# Patient Record
Sex: Male | Born: 1965 | Race: White | Hispanic: No | Marital: Married | Smoking: Never smoker
Health system: Southern US, Community
[De-identification: ages and names within clinical notes are randomized; demographics above are authoritative.]

## PROBLEM LIST (undated history)

## (undated) DIAGNOSIS — I10 Essential (primary) hypertension: Secondary | ICD-10-CM

## (undated) DIAGNOSIS — G4730 Sleep apnea, unspecified: Secondary | ICD-10-CM

## (undated) DIAGNOSIS — F32A Depression, unspecified: Secondary | ICD-10-CM

## (undated) DIAGNOSIS — F429 Obsessive-compulsive disorder, unspecified: Secondary | ICD-10-CM

## (undated) DIAGNOSIS — F419 Anxiety disorder, unspecified: Secondary | ICD-10-CM

## (undated) DIAGNOSIS — Z87442 Personal history of urinary calculi: Secondary | ICD-10-CM

## (undated) DIAGNOSIS — N200 Calculus of kidney: Secondary | ICD-10-CM

## (undated) DIAGNOSIS — R7303 Prediabetes: Secondary | ICD-10-CM

## (undated) HISTORY — DX: Obsessive-compulsive disorder, unspecified: F42.9

## (undated) HISTORY — DX: Calculus of kidney: N20.0

## (undated) HISTORY — DX: Sleep apnea, unspecified: G47.30

## (undated) HISTORY — PX: TONSILLECTOMY: SUR1361

## (undated) HISTORY — PX: KNEE ARTHROSCOPY W/ ACL RECONSTRUCTION: SHX1858

## (undated) HISTORY — PX: UPPER GASTROINTESTINAL ENDOSCOPY: SHX188

## (undated) HISTORY — PX: KIDNEY STONE SURGERY: SHX686

---
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
## 2004-05-28 ENCOUNTER — Encounter: Admission: RE | Admit: 2004-05-28 | Discharge: 2004-05-28 | Payer: Self-pay | Admitting: Sports Medicine

## 2004-06-04 ENCOUNTER — Encounter: Admission: RE | Admit: 2004-06-04 | Discharge: 2004-06-04 | Payer: Self-pay | Admitting: Sports Medicine

## 2004-06-10 ENCOUNTER — Encounter: Admission: RE | Admit: 2004-06-10 | Discharge: 2004-06-10 | Payer: Self-pay | Admitting: Family Medicine

## 2004-06-20 ENCOUNTER — Ambulatory Visit: Payer: Self-pay | Admitting: Sports Medicine

## 2004-07-18 ENCOUNTER — Ambulatory Visit: Payer: Self-pay | Admitting: Sports Medicine

## 2004-10-10 ENCOUNTER — Ambulatory Visit: Payer: Self-pay | Admitting: Sports Medicine

## 2004-10-17 ENCOUNTER — Ambulatory Visit: Payer: Self-pay | Admitting: Sports Medicine

## 2016-08-06 ENCOUNTER — Emergency Department (HOSPITAL_COMMUNITY)
Admission: EM | Admit: 2016-08-06 | Discharge: 2016-08-06 | Disposition: A | Discharge status: Home / Self Care | Payer: BC Managed Care – PPO | Attending: Emergency Medicine | Admitting: Emergency Medicine

## 2016-08-06 ENCOUNTER — Emergency Department (HOSPITAL_COMMUNITY): Payer: BC Managed Care – PPO

## 2016-08-06 ENCOUNTER — Encounter (HOSPITAL_COMMUNITY): Payer: Self-pay | Admitting: Emergency Medicine

## 2016-08-06 DIAGNOSIS — Y92410 Unspecified street and highway as the place of occurrence of the external cause: Secondary | ICD-10-CM | POA: Insufficient documentation

## 2016-08-06 DIAGNOSIS — Y999 Unspecified external cause status: Secondary | ICD-10-CM | POA: Diagnosis not present

## 2016-08-06 DIAGNOSIS — S3992XA Unspecified injury of lower back, initial encounter: Secondary | ICD-10-CM | POA: Diagnosis present

## 2016-08-06 DIAGNOSIS — S161XXA Strain of muscle, fascia and tendon at neck level, initial encounter: Secondary | ICD-10-CM | POA: Diagnosis not present

## 2016-08-06 DIAGNOSIS — R42 Dizziness and giddiness: Secondary | ICD-10-CM | POA: Insufficient documentation

## 2016-08-06 DIAGNOSIS — Y939 Activity, unspecified: Secondary | ICD-10-CM | POA: Diagnosis not present

## 2016-08-06 DIAGNOSIS — I10 Essential (primary) hypertension: Secondary | ICD-10-CM | POA: Diagnosis not present

## 2016-08-06 DIAGNOSIS — S39012A Strain of muscle, fascia and tendon of lower back, initial encounter: Secondary | ICD-10-CM | POA: Diagnosis not present

## 2016-08-06 HISTORY — DX: Essential (primary) hypertension: I10

## 2016-08-06 MED ORDER — IBUPROFEN 800 MG PO TABS: 800.0000 mg | ORAL_TABLET | Freq: Once | ORAL | Status: DC

## 2016-08-06 NOTE — Discharge Instructions (Signed)
Take two naproxen (Aleve) tablets, twice a day. Take acetaminophen as needed for pain. Apply ice to any painful area.
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
## 2017-11-04 ENCOUNTER — Encounter: Payer: Self-pay | Admitting: Internal Medicine

## 2017-12-15 ENCOUNTER — Other Ambulatory Visit: Payer: Self-pay

## 2017-12-15 ENCOUNTER — Ambulatory Visit (AMBULATORY_SURGERY_CENTER): Payer: Self-pay | Admitting: *Deleted

## 2017-12-15 VITALS — Ht 67.0 in | Wt 200.0 lb

## 2017-12-15 DIAGNOSIS — Z1211 Encounter for screening for malignant neoplasm of colon: Secondary | ICD-10-CM

## 2017-12-15 MED ORDER — NA SULFATE-K SULFATE-MG SULF 17.5-3.13-1.6 GM/177ML PO SOLN: ORAL | 0 refills | Status: DC

## 2017-12-15 NOTE — Progress Notes (Signed)
Patient denies any allergies to eggs or soy. Patient denies any problems with anesthesia/sedation. Patient denies any oxygen use at home. Patient denies taking any diet/weight loss medications or blood thinners. EMMI education assisgned to patient on colonoscopy, this was explained and instructions given to patient. Suprep coupon printed and given to pt during PV.

## 2017-12-16 ENCOUNTER — Encounter: Payer: Self-pay | Admitting: Internal Medicine

## 2017-12-29 ENCOUNTER — Encounter: Payer: Self-pay | Admitting: Internal Medicine

## 2017-12-29 ENCOUNTER — Ambulatory Visit (AMBULATORY_SURGERY_CENTER): Payer: BC Managed Care – PPO | Admitting: Internal Medicine

## 2017-12-29 ENCOUNTER — Other Ambulatory Visit: Payer: Self-pay

## 2017-12-29 VITALS — BP 107/79 | HR 75 | Temp 98.0°F | Resp 11 | Ht 67.0 in | Wt 200.0 lb

## 2017-12-29 DIAGNOSIS — Z1212 Encounter for screening for malignant neoplasm of rectum: Secondary | ICD-10-CM | POA: Diagnosis not present

## 2017-12-29 DIAGNOSIS — Z1211 Encounter for screening for malignant neoplasm of colon: Secondary | ICD-10-CM | POA: Diagnosis not present

## 2017-12-29 MED ORDER — SODIUM CHLORIDE 0.9 % IV SOLN: 500.0000 mL | Freq: Once | INTRAVENOUS | Status: DC

## 2017-12-29 NOTE — Progress Notes (Signed)
Pt's states no medical or surgical changes since previsit or office visit. 

## 2017-12-29 NOTE — Progress Notes (Signed)
Report given to PACU, vss 

## 2017-12-29 NOTE — Op Note (Signed)
[facility] Patient Name: Dakota Munoz Procedure Date: 12/29/2017 11:08 AM MRN: [id number] Endoscopist: Dakota Munoz , MD Age: 52 Referring MD:  Date of Birth: Jun 26, 1966 Gender: Male Account #: [id number] Procedure:                Colonoscopy Indications:              Screening for colorectal malignant neoplasm, This                            is the patient's first colonoscopy Medicines:                Monitored Anesthesia Care Procedure:                Pre-Anesthesia Assessment:                           - Prior to the procedure, a History and Physical                            was performed, and patient medications and                            allergies were reviewed. The patient's tolerance of                            previous anesthesia was also reviewed. The risks                            and benefits of the procedure and the sedation                            options and risks were discussed with the patient.                            All questions were answered, and informed consent                            was obtained. Prior Anticoagulants: The patient has                            taken no previous anticoagulant or antiplatelet                            agents. ASA Grade Assessment: II - A patient with                            mild systemic disease. After reviewing the risks                            and benefits, the patient was deemed in                            satisfactory condition to undergo the procedure.  After obtaining informed consent, the colonoscope                            was passed under direct vision. Throughout the                            procedure, the patient's blood pressure, pulse, and                            oxygen saturations were monitored continuously. The                            Colonoscope was introduced through the anus and                            advanced to the the cecum,  identified by                            appendiceal orifice and ileocecal valve. The                            colonoscopy was performed without difficulty. The                            patient tolerated the procedure well. The quality                            of the bowel preparation was excellent. The                            ileocecal valve, appendiceal orifice, and rectum                            were photographed. Scope In: 11:15:11 AM Scope Out: 11:25:07 AM Scope Withdrawal Time: 0 hours 7 minutes 45 seconds  Total Procedure Duration: 0 hours 9 minutes 56 seconds  Findings:                 The digital rectal exam was normal.                           Internal hemorrhoids were found during endoscopy,                            retroflexion in the rectum was not performed due to                            anatomy (narrow rectal vault). The hemorrhoids were                            medium-sized.                           The exam was otherwise without abnormality. No  polyps seen. Complications:            No immediate complications. Estimated Blood Loss:     Estimated blood loss: none. Impression:               - Internal hemorrhoids.                           - The examination was otherwise normal. No polyps                            seen.                           - No specimens collected. Recommendation:           - Patient has a contact number available for                            emergencies. The signs and symptoms of potential                            delayed complications were discussed with the                            patient. Return to normal activities tomorrow.                            Written discharge instructions were provided to the                            patient.                           - Resume previous diet.                           - Continue present medications.                           - Repeat  colonoscopy in 10 years for screening                            purposes. Dakota FiedlerJay M Blakelyn Dinges, MD 12/29/2017 11:29:47 AM This report has been signed electronically.

## 2017-12-29 NOTE — Patient Instructions (Signed)
Handout given on hemorrhoids.  YOU HAD AN ENDOSCOPIC PROCEDURE TODAY: Refer to the procedure report and other information in the discharge instructions given to you for any specific questions about what was found during the examination. If this information does not answer your questions, please call [place] office at [phone] to clarify.   YOU SHOULD EXPECT: Some feelings of bloating in the abdomen. Passage of more gas than usual. Walking can help get rid of the air that was put into your GI tract during the procedure and reduce the bloating. If you had a lower endoscopy (such as a colonoscopy or flexible sigmoidoscopy) you may notice spotting of blood in your stool or on the toilet paper. Some abdominal soreness may be present for a day or two, also.  DIET: Your first meal following the procedure should be a light meal and then it is ok to progress to your normal diet. A half-sandwich or bowl of soup is an example of a good first meal. Heavy or fried foods are harder to digest and may make you feel nauseous or bloated. Drink plenty of fluids but you should avoid alcoholic beverages for 24 hours. If you had a esophageal dilation, please see attached instructions for diet.    ACTIVITY: Your care partner should take you home directly after the procedure. You should plan to take it easy, moving slowly for the rest of the day. You can resume normal activity the day after the procedure however YOU SHOULD NOT DRIVE, use power tools, machinery or perform tasks that involve climbing or major physical exertion for 24 hours (because of the sedation medicines used during the test).   SYMPTOMS TO REPORT IMMEDIATELY: A gastroenterologist can be reached at any hour. Please call [phone]  for any of the following symptoms:  . Following lower endoscopy (colonoscopy, flexible sigmoidoscopy) Excessive amounts of blood in the stool  Significant tenderness, worsening of abdominal pains  Swelling of the abdomen  that is new, acute  Fever of 100 or higher  FOLLOW UP:  If any biopsies were taken you will be contacted by phone or by letter within the next 1-3 weeks. Call [phone]  if you have not heard about the biopsies in 3 weeks.  Please also call with any specific questions about appointments or follow up tests. 

## 2017-12-30 ENCOUNTER — Telehealth: Payer: Self-pay

## 2017-12-30 NOTE — Telephone Encounter (Signed)
  Follow up Call-  Call back number 12/29/2017  Post procedure Call Back phone  # [phone]  Permission to leave phone message Yes  Some recent data might be hidden     Patient questions:  Do you have a fever, pain , or abdominal swelling? No. Pain Score  0 *  Have you tolerated food without any problems? Yes.    Have you been able to return to your normal activities? Yes.    Do you have any questions about your discharge instructions: Diet   No. Medications  No. Follow up visit  No.  Do you have questions or concerns about your Care? No.  Actions: * If pain score is 4 or above: No action needed, pain <4.

## 2018-01-11 ENCOUNTER — Ambulatory Visit: Payer: BC Managed Care – PPO | Admitting: Internal Medicine

## 2018-11-24 ENCOUNTER — Other Ambulatory Visit: Payer: Self-pay | Admitting: Urology

## 2018-11-28 ENCOUNTER — Ambulatory Visit (HOSPITAL_COMMUNITY): Admission: RE | Admit: 2018-11-28 | Payer: BC Managed Care – PPO | Source: Home / Self Care | Admitting: Urology

## 2018-11-28 SURGERY — LITHOTRIPSY, ESWL
Anesthesia: LOCAL | Laterality: Left

## 2018-12-08 ENCOUNTER — Other Ambulatory Visit: Payer: Self-pay | Admitting: Urology

## 2018-12-14 NOTE — H&P (Signed)
CC: I have a history of kidney stones.  HPI: Dakota Munoz is a 53 year-old male established patient who is here for F/U due to a history of renal calculi.  10/24/2018: He was last seen in the Spring of 2018 for a left ureteral calculi seen on KUB imaging performed at the time of his last visit. The patient did not f/u. Presents today for concerns related to possible kidney stone. He tells me he passed a stone after being seen last in 2018. That was the last stone event he had.   Complaining of left lower back pain radiating into the left flank and left lower abdomen and with associated nausea and intermittent burning with urination 3 days. He has not had gross hematuria or known passage of stone material. Denies an increase in frequency/urgency, changes in urine stream. Denies fevers.    11/08/2018: KUB at last visit demonstrated a left proximal ureteral calculi. Medical expulsive therapy was initiated. He has continued to have intermittent radiating left lower back pain into the left flank as well as new pain in the right lower back. Denies interval stone material passage. He has not had burning or painful urination, gross hematuria. He has encountered some intermittent urinary urgency and intermittency of urine stream. Denies fevers/chills, tolerating oral intake without issue.     The patient was last seen 10/24/2017. The patient's stone was on his left side. He did not pass a stone since the last office visit. The patient has been taking Tamsulosin for their calculus disease.   The patient has had flank pain since they were last seen. The patient has developed urgency and intermittency. He has no seen blood in his urine since the last visit. He is currently having back pain. He denies having flank pain, groin pain, nausea, vomiting, fever, and chills. He has had Ureteroscopy for treatment of his stones in the past.     ALLERGIES: No Allergies    MEDICATIONS: Tamsulosin Hcl 0.4 mg  capsule 1 capsule PO Daily  Losartan Potassium 25 mg tablet  Ondansetron Odt 4 mg tablet,disintegrating 1 tablet PO Q 6 H PRN  Zoloft 25 mg tablet     GU PSH: Cystoscopy Ureteroscopy - 2008      PSH Notes: Cystoscopy With Ureteroscopy Right, Kidney Surgery Right wrist surg, And an ACL repair   NON-GU PSH: No Non-GU PSH    GU PMH: Renal calculus - 10/24/2018, 3 left renal calculi not in a position where they would cause symptoms. He has a 3 mm left renal stone which is symptomatic., - 02/17/2017, Kidney stone on left side, - 2015 Ureteral calculus, Left - 10/24/2018, Proximal Ureteral Stone On The Left, - 2014 Abdominal Pain Unspec, Pain, flank, bilateral - 2015 Encounter for Prostate Cancer screening, Prostate cancer screening - 2015 Primary hypogonadism, Hypogonadism, testicular - 2015 BPH w/o LUTS, Benign prostatic hypertrophy without lower urinary tract symptoms - 2014 ED due to arterial insufficiency, Erectile dysfunction due to arterial insufficiency - 2014 History of urolithiasis, Nephrolithiasis - 2014 Low back pain, Lumbago - 2014 Personal Hx Oth male genital organs diseases, History of epididymitis - 2014      PMH Notes:  2012-02-03 15:41:39 - Note: Urinary Calculus On The Right   NON-GU PMH: Encounter for general adult medical examination without abnormal findings, Encounter for preventive health examination - 2015 Fever, unspecified, Fever - 2015 Anxiety    FAMILY HISTORY: Death - Mother Heart Disease - Uncle, Father, Grandmother nephrolithiasis - Father  SOCIAL HISTORY: Marital Status: Married Preferred Language: English; Ethnicity: Not Hispanic Or Latino; Race: White Current Smoking Status: Patient has never smoked.   Tobacco Use Assessment Completed: Used Tobacco in last 30 days? Social Drinker.  Drinks 4+ caffeinated drinks per day. Patient's occupation Psychologist, clinical.     Notes: Never A Smoker, Alcohol Use, Occupation:, Caffeine Use, Marital  History - Currently Married   REVIEW OF SYSTEMS:    GU Review Male:   Patient denies have to strain to urinate , erection problems, hard to postpone urination, frequent urination, trouble starting your stream, burning/ pain with urination, get up at night to urinate, stream starts and stops, penile pain, and leakage of urine.  Gastrointestinal (Upper):   Patient denies nausea, vomiting, and indigestion/ heartburn.  Gastrointestinal (Lower):   Patient denies diarrhea and constipation.  Constitutional:   Patient denies fever, night sweats, weight loss, and fatigue.  Skin:   Patient denies skin rash/ lesion and itching.  Eyes:   Patient denies blurred vision and double vision.  Ears/ Nose/ Throat:   Patient denies sore throat and sinus problems.  Hematologic/Lymphatic:   Patient denies swollen glands and easy bruising.  Cardiovascular:   Patient denies leg swelling and chest pains.  Respiratory:   Patient denies cough and shortness of breath.  Endocrine:   Patient denies excessive thirst.  Musculoskeletal:   Patient reports back pain. Patient denies joint pain.  Neurological:   Patient denies headaches and dizziness.  Psychologic:   Patient denies depression and anxiety.     MULTI-SYSTEM PHYSICAL EXAMINATION:    Constitutional: Well-nourished. No physical deformities. Normally developed. Good grooming.  Neck: Neck symmetrical, not swollen. Normal tracheal position.  Respiratory: Normal breath sounds. No labored breathing, no use of accessory muscles.   Cardiovascular: Regular rate and rhythm. No murmur, no gallop. Normal temperature, normal extremity pulses, no swelling, no varicosities.   Neurologic / Psychiatric: Oriented to time, oriented to place, oriented to person. No depression, no anxiety, no agitation.  Gastrointestinal: No mass, no tenderness, no rigidity, non obese abdomen. There is no CVA, flank, lower abdominal tenderness.  Musculoskeletal: Normal gait and station of head and  neck.     PAST DATA REVIEWED:  Source Of History:  Patient  Records Review:   Previous Patient Records  Urine Test Review:   Urinalysis  X-Ray Review: KUB: Reviewed Films. Discussed With Patient.  [facility] Protocol: Reviewed Films. Discussed With Patient.     01/31/14 01/26/12 03/12/11 01/22/10  PSA  Total PSA 0.68  0.53  0.72  0.77     01/31/14 11/15/12 01/26/12 10/28/11 03/12/11 01/22/10 10/23/09 09/16/09  Hormones  Testosterone, Total 250  526.55  602.07  988.44  845.64  617.0  473.0  327.0     11/08/18  Urinalysis  Urine Appearance Clear   Urine Color Yellow   Urine Glucose Neg mg/dL  Urine Bilirubin Neg mg/dL  Urine Ketones Neg mg/dL  Urine Specific Gravity 1.020   Urine Blood Trace ery/uL  Urine pH 5.5   Urine Protein Neg mg/dL  Urine Urobilinogen 0.2 mg/dL  Urine Nitrites Neg   Urine Leukocyte Esterase Neg leu/uL  Urine WBC/hpf 0 - 5/hpf   Urine RBC/hpf 0 - 2/hpf   Urine Epithelial Cells 0 - 5/hpf   Urine Bacteria Few (10-25/hpf)   Urine Mucous Not Present   Urine Yeast NS (Not Seen)   Urine Trichomonas Not Present   Urine Cystals NS (Not Seen)   Urine Casts NS (Not Seen)  Urine Sperm Not Present    PROCEDURES:         C.T. Urogram - [id number]                KUB - [id number]  A single view of the abdomen is obtained. There is a new opacity overlying the proximal portion of the right sacrum that was not clearly defined on last imaging study. Based on this, I can't exclude the possibility of a right ureteral calculus in the proximal portion of the right ureter. 36mm opacity noted on the left between left L3 and L4. This is the previously seen left proximal ureteral calculus that has only descended a few cm from imaging study two weeks ago.               Urinalysis w/Scope Dipstick Dipstick Cont'd Micro  Color: Yellow Bilirubin: Neg mg/dL WBC/hpf: 0 - 5/hpf  Appearance: Clear Ketones: Neg mg/dL RBC/hpf: 0 - 2/hpf  Specific Gravity: 1.020 Blood: Trace ery/uL  Bacteria: Few (10-25/hpf)  pH: 5.5 Protein: Neg mg/dL Cystals: NS (Not Seen)  Glucose: Neg mg/dL Urobilinogen: 0.2 mg/dL Casts: NS (Not Seen)    Nitrites: Neg Trichomonas: Not Present    Leukocyte Esterase: Neg leu/uL Mucous: Not Present      Epithelial Cells: 0 - 5/hpf      Yeast: NS (Not Seen)      Sperm: Not Present    ASSESSMENT:      ICD-10 Details  1 GU:   Renal calculus - N20.0   2   Ureteral calculus - N20.1 Left   PLAN:            Medications Refill Meds: Oxycodone-Acetaminophen 5 mg-325 mg tablet 1 tablet PO Q 6 H PRN   #10  0 Refill(s)            Orders Labs Urine Culture  X-Rays: [facility] Protocol Without Contrast - r/o right ureteral calculi, known left ureteral calculus    KUB  X-Ray Notes: History:  Hematuria: Yes/No  Patient to see MD after exam: Yes/No  Previous exam: CT / IVP/ US/ KUB/ None  When:  Where:  Diabetic: Yes/ No  BUN/ Creatinine:  Date of last BUN Creatinine:  Weight in pounds:  Allergy- IV Contrast: Yes/ No  Conflicting diabetic meds: Yes/ No  Diabetic Meds:  Prior Authorization #: [id number]           Schedule Return Visit/Planned Activity: 1-2 Weeks - Schedule Surgery           Notes:   CT imaging did not show an obvious right-sided ureteral calculus or obstructive signs on that side. I'll follow-up with him regarding official radiologist's interpretation. It did confirm the presence of a left proximal ureteral calculi with moderate upstream hydronephrosis. Historically the patient has not been any passed stones requiring some form of procedural intervention. Given the calculus in question was easily visualized on today's KUB, I recommended shockwave lithotripsy to him and his wife, pending his urologist's approval.   For shockwave lithotripsy I described the risks which include arrhythmia, kidney contusion, kidney hemorrhage, need for transfusion, long-term risk of diabetes or hypertension, back discomfort, flank  ecchymosis, flank abrasion, inability to break up stone, inability to pass stone fragments, Steinstrasse, infection associated with obstructing stones, need for different surgical procedure and possible need for repeat shockwave lithotripsy.   He will continue tamsulosin and Zofran as needed. I gave her refill for some additional pain medication today but recommended using ibuprofen until  2 days before planned procedure. He'll telephone [place] with any worsening symptoms, fevers or interval same material passage.

## 2018-12-15 ENCOUNTER — Ambulatory Visit (HOSPITAL_COMMUNITY)
Admission: RE | Admit: 2018-12-15 | Discharge: 2018-12-15 | Disposition: A | Discharge status: Home / Self Care | Payer: BC Managed Care – PPO | Attending: Urology | Admitting: Urology

## 2018-12-15 ENCOUNTER — Encounter (HOSPITAL_COMMUNITY): Payer: Self-pay | Admitting: General Practice

## 2018-12-15 ENCOUNTER — Ambulatory Visit (HOSPITAL_COMMUNITY): Payer: BC Managed Care – PPO

## 2018-12-15 ENCOUNTER — Encounter (HOSPITAL_COMMUNITY)
Admission: RE | Disposition: A | Discharge status: Home / Self Care | Payer: Self-pay | Source: Home / Self Care | Attending: Urology

## 2018-12-15 DIAGNOSIS — F419 Anxiety disorder, unspecified: Secondary | ICD-10-CM | POA: Diagnosis not present

## 2018-12-15 DIAGNOSIS — N201 Calculus of ureter: Secondary | ICD-10-CM

## 2018-12-15 DIAGNOSIS — N202 Calculus of kidney with calculus of ureter: Secondary | ICD-10-CM | POA: Diagnosis present

## 2018-12-15 DIAGNOSIS — Z79899 Other long term (current) drug therapy: Secondary | ICD-10-CM | POA: Diagnosis not present

## 2018-12-15 HISTORY — PX: EXTRACORPOREAL SHOCK WAVE LITHOTRIPSY: SHX1557

## 2018-12-15 HISTORY — DX: Personal history of urinary calculi: Z87.442

## 2018-12-15 SURGERY — LITHOTRIPSY, ESWL
Anesthesia: LOCAL | Laterality: Left

## 2018-12-15 MED ORDER — DIAZEPAM 5 MG PO TABS
10.0000 mg | ORAL_TABLET | ORAL | Status: AC
  Administered 2018-12-15: 10 mg via ORAL
  Filled 2018-12-15: qty 2

## 2018-12-15 MED ORDER — OXYCODONE-ACETAMINOPHEN 5-325 MG PO TABS: 1.0000 | ORAL_TABLET | Freq: Four times a day (QID) | ORAL | 0 refills | Status: AC | PRN

## 2018-12-15 MED ORDER — SODIUM CHLORIDE 0.9 % IV SOLN
INTRAVENOUS | Status: DC
  Administered 2018-12-15: 09:00:00 via INTRAVENOUS

## 2018-12-15 MED ORDER — DIPHENHYDRAMINE HCL 25 MG PO CAPS
25.0000 mg | ORAL_CAPSULE | ORAL | Status: AC
  Administered 2018-12-15: 25 mg via ORAL
  Filled 2018-12-15: qty 1

## 2018-12-15 MED ORDER — CIPROFLOXACIN HCL 500 MG PO TABS
500.0000 mg | ORAL_TABLET | ORAL | Status: AC
  Administered 2018-12-15: 500 mg via ORAL
  Filled 2018-12-15: qty 1

## 2018-12-15 NOTE — Op Note (Signed)
See [facility] operative note scanned into chart. Also because of the size, density, location and other factors that cannot be anticipated I feel this will likely be a staged procedure. This fact supersedes any indication in the scanned [facility] operative note to the contrary.  

## 2018-12-15 NOTE — Discharge Instructions (Signed)
1. You should strain your urine and collect all fragments and bring them to your follow up appointment.  °2. You should take your pain medication as needed.  Please call if your pain is severe to the point that it is not controlled with your pain medication. °3. You should call if you develop fever > 101 or persistent nausea or vomiting. °4. Your doctor may prescribe tamsulosin to take to help facilitate stone passage. °

## 2018-12-16 ENCOUNTER — Encounter (HOSPITAL_COMMUNITY): Payer: Self-pay | Admitting: Urology

## 2021-01-09 DIAGNOSIS — Z8249 Family history of ischemic heart disease and other diseases of the circulatory system: Secondary | ICD-10-CM | POA: Insufficient documentation

## 2021-01-09 DIAGNOSIS — I10 Essential (primary) hypertension: Secondary | ICD-10-CM | POA: Insufficient documentation

## 2021-01-09 NOTE — Progress Notes (Signed)
Cardiology Office Note   Date:  01/10/2021   ID:  Dakota Munoz, DOB [birth year], MRN [id number]  PCP:  Ignacia Palma., MD  Cardiologist:   Rollene Rotunda, MD Referring:   Self  Chief Complaint  Patient presents with  . Chest Pain      History of Present Illness: Dakota Munoz is a 55 y.o. male who was a self referral secondary to a family history of CAD.  His father had a heart attack in his 45s.  He died of a stroke in his 50s.  His sister actually is a half sister with the same father has an ICD that she did not have coronary disease and we texted her during the visit.  Hers was related to valvular disease and apparently some other issues.  He is very active.  He does a lot of yard work.  However, he does get some chest discomfort.  This is right-sided.  It happens more at rest.  Its not typically with the activities.  It is sharp.  At times he has noticed that after meals.  This lasts for a few seconds.  He does not have any radiation to his jaw or to his arms.  He has no associated shortness of breath, nausea or vomiting.  Has had no new palpitations, presyncope or syncope.  He has never had this kind of discomfort before.  Past Medical History:  Diagnosis Date  . History of kidney stones   . Hypertension   . Kidney stones   . OCD (obsessive compulsive disorder)   . Sleep apnea    does not use the CPAP he has at home     Past Surgical History:  Procedure Laterality Date  . EXTRACORPOREAL SHOCK WAVE LITHOTRIPSY Left 12/15/2018   Procedure: EXTRACORPOREAL SHOCK WAVE LITHOTRIPSY (ESWL);  Surgeon: Heloise Purpura, MD;  Location: [facility];  Service: Urology;  Laterality: Left;  . KIDNEY STONE SURGERY    . KNEE ARTHROSCOPY W/ ACL RECONSTRUCTION Left    x2- left  . TONSILLECTOMY    . UPPER GASTROINTESTINAL ENDOSCOPY  15 years ago    in [place]  . WRIST ARTHROSCOPY Right 1976     Current Outpatient Medications  Medication Sig Dispense Refill  . buPROPion (WELLBUTRIN XL)  150 MG 24 hr tablet Take 150 mg by mouth daily.    Marland Kitchen losartan (COZAAR) 50 MG tablet Take 25 mg by mouth daily.    . ondansetron (ZOFRAN) 4 MG tablet Take 4 mg by mouth every 8 (eight) hours as needed for nausea or vomiting.    Marland Kitchen oxyCODONE-acetaminophen (PERCOCET/ROXICET) 5-325 MG tablet Take 1-2 tablets by mouth every 4 (four) hours as needed for severe pain.    Marland Kitchen sertraline (ZOLOFT) 25 MG tablet Take 25 mg by mouth daily.    . tamsulosin (FLOMAX) 0.4 MG CAPS capsule Take 0.4 mg by mouth.     No current facility-administered medications for this visit.    Allergies:   Patient has no known allergies.    Social History:  The patient  reports that he has never smoked. He has never used smokeless tobacco. He reports current alcohol use. He reports that he does not use drugs.   Family History:  The patient's family history includes Heart attack (age of onset: 64) in his father; Heart disease in his sister; Stroke (age of onset: 85) in his father; Ulcerative colitis in his son.    ROS:  Please see the history of present illness.  Otherwise, review of systems are positive for none.   All other systems are reviewed and negative.    PHYSICAL EXAM: VS:  BP 132/84   Pulse 73   Ht 5\' 7"  (1.702 m)   Wt 197 lb 9.6 oz (89.6 kg)   SpO2 97%   BMI 30.95 kg/m  , BMI Body mass index is 30.95 kg/m. GENERAL:  Well appearing HEENT:  Pupils equal round and reactive, fundi not visualized, oral mucosa unremarkable NECK:  No jugular venous distention, waveform within normal limits, carotid upstroke brisk and symmetric, no bruits, no thyromegaly LYMPHATICS:  No cervical, inguinal adenopathy LUNGS:  Clear to auscultation bilaterally BACK:  No CVA tenderness CHEST:  Unremarkable HEART:  PMI not displaced or sustained,S1 and S2 within normal limits, no S3, no S4, no clicks, no rubs, no murmurs ABD:  Flat, positive bowel sounds normal in frequency in pitch, no bruits, no rebound, no guarding, no midline  pulsatile mass, no hepatomegaly, no splenomegaly EXT:  2 plus pulses throughout, no edema, no cyanosis no clubbing SKIN:  No rashes no nodules NEURO:  Cranial nerves II through XII grossly intact, motor grossly intact throughout PSYCH:  Cognitively intact, oriented to person place and time    EKG:  EKG is ordered today. The ekg ordered today demonstrates sinus rhythm, incomplete right bundle branch block, axis within normal limits, intervals within normal limits, no acute ST-T wave changes.   Recent Labs: No results found for requested labs within last 8760 hours.    Lipid Panel No results found for: CHOL, TRIG, HDL, CHOLHDL, VLDL, LDLCALC, LDLDIRECT    Wt Readings from Last 3 Encounters:  01/10/21 197 lb 9.6 oz (89.6 kg)  12/15/18 190 lb (86.2 kg)  12/29/17 200 lb (90.7 kg)      Other studies Reviewed: Additional studies/ records that were reviewed today include: Labs. Review of the above records demonstrates:  Please see elsewhere in the note.     ASSESSMENT AND PLAN:  SLEEP APNEA: He has had diagnosed sleep apnea but does not use CPAP.  The test was years ago and it was severe apparently.  He be interested in getting further evaluation of this and I will refer him to Dr. 12/31/17.  HTN: His blood pressure is controlled.  He will continue with meds as listed.  FAMILY HISTORY OF CAD: See below  CHEST PAIN: His chest pain is somewhat unusual for angina but he has significant cardiovascular risk factors.  I think the pretest probability of coronary artery disease is somewhat moderate and so I am going to send him for a POET (Plain Old Exercise Treadmill) and a coronary calcium score.  This will help Tresa Endo also determine goals of therapy.  DYSLIPIDEMIA: LDL is 155.  We will determine goals of therapy based on his calcium score.  We did talk about the Mediterranean diet already.  Current medicines are reviewed at length with the patient today.  The patient does not have concerns  regarding medicines.  The following changes have been made:  no change  Labs/ tests ordered today include:   Orders Placed This Encounter  Procedures  . CT CARDIAC SCORING (SELF PAY ONLY)  . EXERCISE TOLERANCE TEST (ETT)  . EKG 12-Lead     Disposition:   FU with me based on the results of the above   Signed, [place], MD  01/10/2021 5:38 PM    [facility]

## 2021-01-10 ENCOUNTER — Ambulatory Visit (INDEPENDENT_AMBULATORY_CARE_PROVIDER_SITE_OTHER): Payer: BC Managed Care – PPO | Admitting: Cardiology

## 2021-01-10 ENCOUNTER — Encounter: Payer: Self-pay | Admitting: Cardiology

## 2021-01-10 ENCOUNTER — Other Ambulatory Visit: Payer: Self-pay

## 2021-01-10 VITALS — BP 132/84 | HR 73 | Ht 67.0 in | Wt 197.6 lb

## 2021-01-10 DIAGNOSIS — Z8249 Family history of ischemic heart disease and other diseases of the circulatory system: Secondary | ICD-10-CM

## 2021-01-10 DIAGNOSIS — I10 Essential (primary) hypertension: Secondary | ICD-10-CM | POA: Diagnosis not present

## 2021-01-10 DIAGNOSIS — G4733 Obstructive sleep apnea (adult) (pediatric): Secondary | ICD-10-CM | POA: Diagnosis not present

## 2021-01-10 DIAGNOSIS — R079 Chest pain, unspecified: Secondary | ICD-10-CM | POA: Diagnosis not present

## 2021-01-10 NOTE — Patient Instructions (Signed)
Medication Instructions:  Your physician recommends that you continue on your current medications as directed. Please refer to the Current Medication list given to you today.  *If you need a refill on your cardiac medications before your next appointment, please call your pharmacy*  Lab Work:  You will need a COVID-19  test prior to your procedure.  This is a Drive Up Visit at [address] [place], [place] [address]. Someone will direct you to the appropriate testing line. Stay in your car and someone will be with you shortly.   If you have labs (blood work) drawn today and your tests are completely normal, you will receive your results only by: Marland Kitchen MyChart Message (if you have MyChart) OR . A paper copy in the mail If you have any lab test that is abnormal or we need to change your treatment, we will call you to review the results.  Testing/Procedures: Your physician has requested that you have an exercise tolerance test. For further information please visit [email or web address] Please also follow instruction sheet, as given.  Dr. Antoine Poche has ordered a CT coronary calcium score. This test is done at [address]. This is $99 out of pocket.   Coronary CalciumScan A coronary calcium scan is an imaging test used to look for deposits of calcium and other fatty materials (plaques) in the inner lining of the blood vessels of the heart (coronary arteries). These deposits of calcium and plaques can partly clog and narrow the coronary arteries without producing any symptoms or warning signs. This puts a person at risk for a heart attack. This test can detect these deposits before symptoms develop. Tell a health care provider about:  Any allergies you have.  All medicines you are taking, including vitamins, herbs, eye drops, creams, and over-the-counter medicines.  Any problems you or family members have had with anesthetic medicines.  Any blood disorders you have.  Any  surgeries you have had.  Any medical conditions you have.  Whether you are pregnant or may be pregnant. What are the risks? Generally, this is a safe procedure. However, problems may occur, including:  Harm to a pregnant woman and her unborn baby. This test involves the use of radiation. Radiation exposure can be dangerous to a pregnant woman and her unborn baby. If you are pregnant, you generally should not have this procedure done.  Slight increase in the risk of cancer. This is because of the radiation involved in the test. What happens before the procedure? No preparation is needed for this procedure. What happens during the procedure?  You will undress and remove any jewelry around your neck or chest.  You will put on a hospital gown.  Sticky electrodes will be placed on your chest. The electrodes will be connected to an electrocardiogram (ECG) machine to record a tracing of the electrical activity of your heart.  A CT scanner will take pictures of your heart. During this time, you will be asked to lie still and hold your breath for 2-3 seconds while a picture of your heart is being taken. The procedure may vary among health care providers and hospitals. What happens after the procedure?  You can get dressed.  You can return to your normal activities.  It is up to you to get the results of your test. Ask your health care provider, or the department that is doing the test, when your results will be ready. Summary  A coronary calcium scan is an imaging  test used to look for deposits of calcium and other fatty materials (plaques) in the inner lining of the blood vessels of the heart (coronary arteries).  Generally, this is a safe procedure. Tell your health care provider if you are pregnant or may be pregnant.  No preparation is needed for this procedure.  A CT scanner will take pictures of your heart.  You can return to your normal activities after the scan is done. This  information is not intended to replace advice given to you by your health care provider. Make sure you discuss any questions you have with your health care provider. Document Released: 04/02/2008 Document Revised: 08/24/2016 Document Reviewed: 08/24/2016 Elsevier Interactive Patient Education  2017 [employer].   Follow-Up: At [facility], you and your health needs are our priority.  As part of our continuing mission to provide you with exceptional heart care, we have created designated Provider Care Teams.  These Care Teams include your primary Cardiologist (physician) and Advanced Practice Providers (APPs -  Physician Assistants and Nurse Practitioners) who all work together to provide you with the care you need, when you need it.  We recommend signing up for the patient portal called "MyChart".  Sign up information is provided on this After Visit Summary.  MyChart is used to connect with patients for Virtual Visits (Telemedicine).  Patients are able to view lab/test results, encounter notes, upcoming appointments, etc.  Non-urgent messages can be sent to your provider as well.   To learn more about what you can do with MyChart, go to [email or web address].    Your next appointment:   As needed    The format for your next appointment:   In Person  Provider:   You may see Rollene Rotunda, MD or one of the following Advanced Practice Providers on your designated Care Team:    Theodore Demark, PA-C  Joni Reining, DNP, ANP   Other Instructions

## 2021-01-13 NOTE — Addendum Note (Signed)
Addended by: Dorris Fetch on: 01/13/2021 02:06 PM   Modules accepted: Orders

## 2021-01-15 NOTE — Addendum Note (Signed)
Addended by: Rollene Rotunda on: 01/15/2021 05:08 PM   Modules accepted: Orders

## 2021-01-15 NOTE — Addendum Note (Signed)
Addended by: Dorris Fetch on: 01/15/2021 10:15 AM   Modules accepted: Orders

## 2021-01-24 ENCOUNTER — Telehealth (HOSPITAL_COMMUNITY): Payer: Self-pay

## 2021-01-24 NOTE — Telephone Encounter (Signed)
Close encounter 

## 2021-01-27 ENCOUNTER — Other Ambulatory Visit (HOSPITAL_COMMUNITY)
Admission: RE | Admit: 2021-01-27 | Discharge: 2021-01-27 | Disposition: A | Discharge status: Home / Self Care | Payer: BC Managed Care – PPO | Source: Ambulatory Visit | Attending: Cardiology | Admitting: Cardiology

## 2021-01-27 DIAGNOSIS — Z20822 Contact with and (suspected) exposure to covid-19: Secondary | ICD-10-CM | POA: Insufficient documentation

## 2021-01-27 DIAGNOSIS — Z01812 Encounter for preprocedural laboratory examination: Secondary | ICD-10-CM | POA: Insufficient documentation

## 2021-01-27 LAB — SARS CORONAVIRUS 2 (TAT 6-24 HRS): SARS Coronavirus 2: NEGATIVE

## 2021-01-29 ENCOUNTER — Ambulatory Visit (HOSPITAL_COMMUNITY)
Admission: RE | Admit: 2021-01-29 | Discharge: 2021-01-29 | Disposition: A | Discharge status: Home / Self Care | Payer: BC Managed Care – PPO | Source: Ambulatory Visit | Attending: Cardiology | Admitting: Cardiology

## 2021-01-29 ENCOUNTER — Other Ambulatory Visit: Payer: Self-pay

## 2021-01-29 DIAGNOSIS — Z8249 Family history of ischemic heart disease and other diseases of the circulatory system: Secondary | ICD-10-CM | POA: Insufficient documentation

## 2021-01-29 DIAGNOSIS — R079 Chest pain, unspecified: Secondary | ICD-10-CM | POA: Diagnosis present

## 2021-01-29 LAB — EXERCISE TOLERANCE TEST
Estimated workload: 14.7 METS
Exercise duration (min): 12 min
Exercise duration (sec): 45 s
MPHR: 165 {beats}/min
Peak HR: 176 {beats}/min
Percent HR: 106 %
Rest HR: 80 {beats}/min

## 2021-02-06 ENCOUNTER — Ambulatory Visit (INDEPENDENT_AMBULATORY_CARE_PROVIDER_SITE_OTHER)
Admission: RE | Admit: 2021-02-06 | Discharge: 2021-02-06 | Disposition: A | Discharge status: Home / Self Care | Payer: Self-pay | Source: Ambulatory Visit | Attending: Cardiology | Admitting: Cardiology

## 2021-02-06 ENCOUNTER — Other Ambulatory Visit: Payer: Self-pay

## 2021-02-06 DIAGNOSIS — R079 Chest pain, unspecified: Secondary | ICD-10-CM

## 2021-02-13 ENCOUNTER — Other Ambulatory Visit: Payer: Self-pay

## 2021-08-28 ENCOUNTER — Other Ambulatory Visit: Payer: Self-pay | Admitting: Orthopaedic Surgery

## 2021-08-28 DIAGNOSIS — M25511 Pain in right shoulder: Secondary | ICD-10-CM

## 2021-09-09 ENCOUNTER — Telehealth: Payer: Self-pay

## 2021-09-09 NOTE — Telephone Encounter (Signed)
Letter has been sent to patient informing them that their sleep study has expired. Patient will need to call and schedule an office visit to re-evaluate the need for a sleep study.    

## 2021-09-28 ENCOUNTER — Other Ambulatory Visit: Payer: BC Managed Care – PPO

## 2022-01-20 IMAGING — CT CT CARDIAC CORONARY ARTERY CALCIUM SCORE
3 series · 14 of 20 positions shown, 15 images · non-contrast
Comparison: 08/06/2016 chest radiograph.
COMPARISON: 08/06/2016 chest radiograph.

Addendum:
EXAM:
OVER-READ INTERPRETATION  CT CHEST

The following report is an over-read performed by radiologist Dr.
Misori Habiba [REDACTED] on 02/06/2021. This over-read
does not include interpretation of cardiac or coronary anatomy or
pathology. The calcium score interpretation by the cardiologist is
attached.
CLINICAL DATA: Cardiovascular Disease Risk stratification
Coronary Calcium Score
TECHNIQUE: A gated, non-contrast computed tomography scan of the heart was
performed using 3mm slice thickness. Axial images were analyzed on a
dedicated workstation. Calcium scoring of the coronary arteries was
performed using the Agatston method.

[Series 2: casc 3.0 bv41 2 bestdiast 72 % · axial · 0.36mm/px · z∈[-324,-262]mm · 4 of 37 slices shown, 5 images]
[im 8/37  vessel]
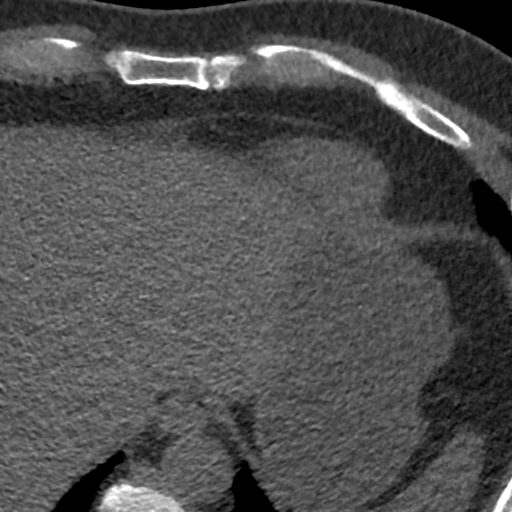
[im 8/37  lung]
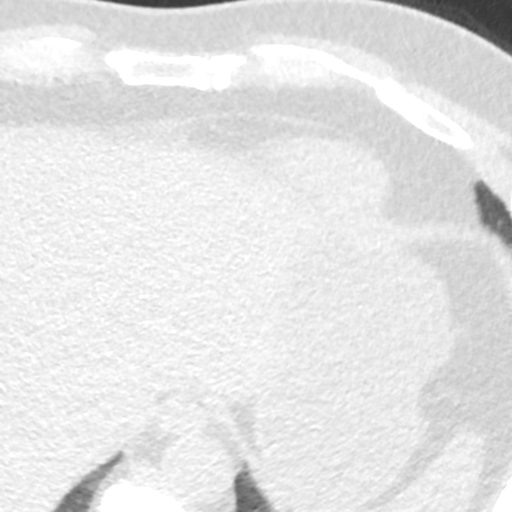
[im 15/37  vessel]
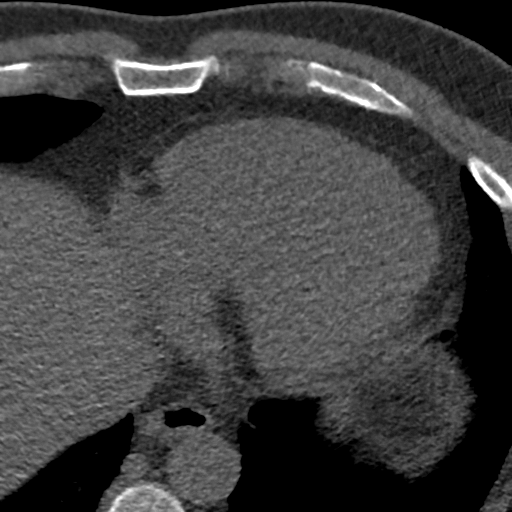
[im 22/37  vessel]
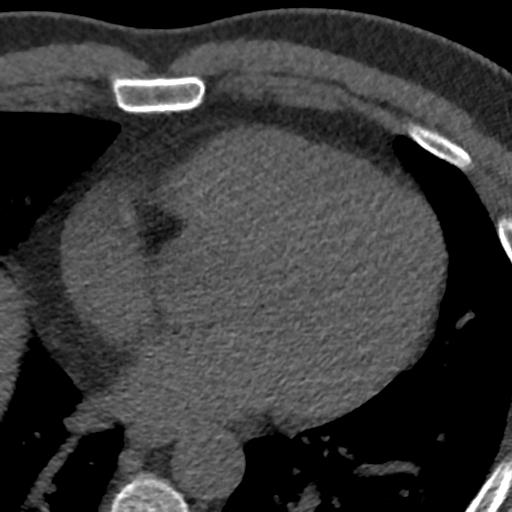
[im 29/37  vessel]
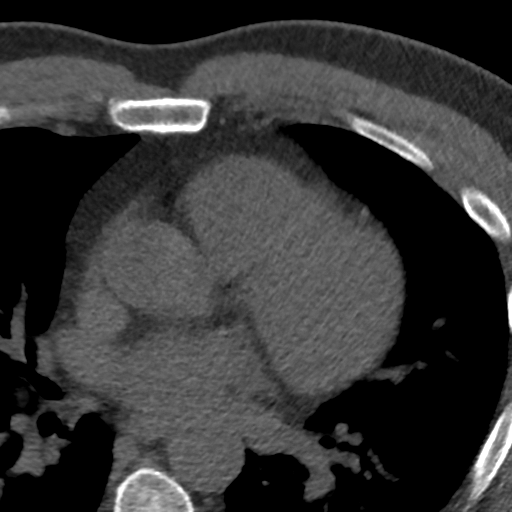

[Series 3: lung 72 % · axial · 0.68mm/px · z∈[-328,-256]mm · 5 of 37 slices shown]
[im 7/37  lung]
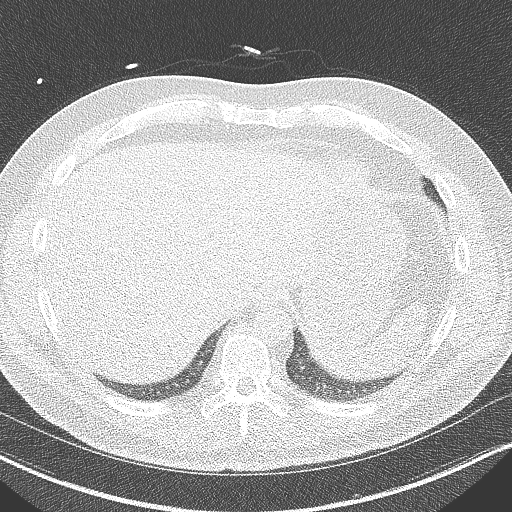
[im 13/37  lung]
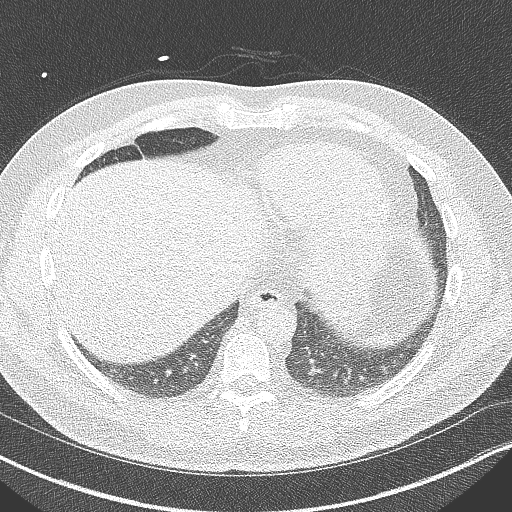
[im 19/37  lung]
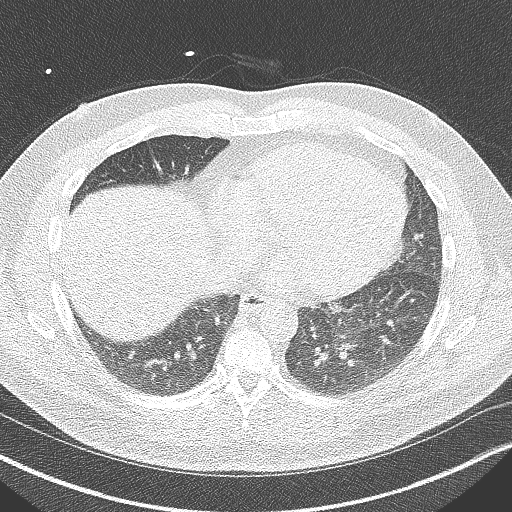
[im 25/37  lung]
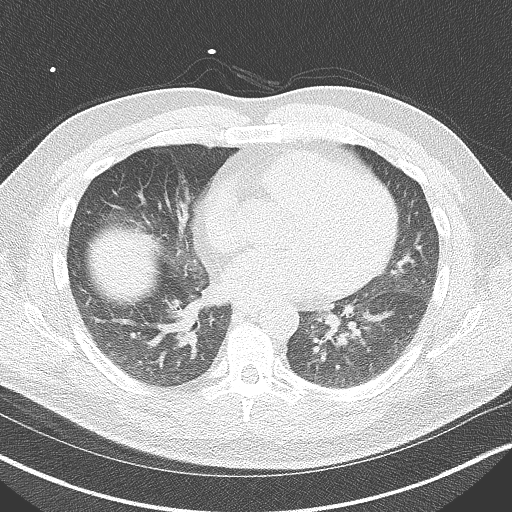
[im 31/37  lung]
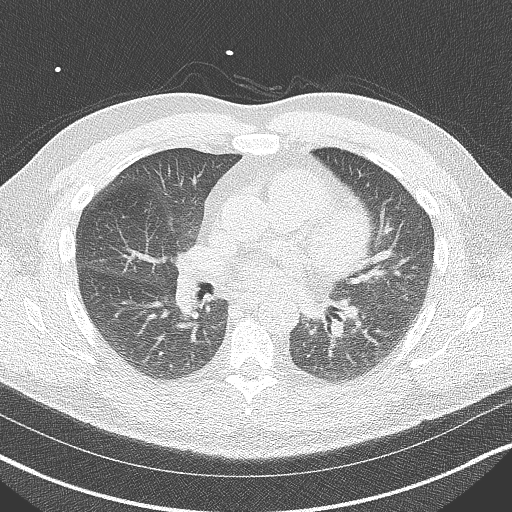

[Series 4: lung st 72 % · axial · 0.68mm/px · z∈[-328,-256]mm · 5 of 37 slices shown]
[im 7/37  lung]
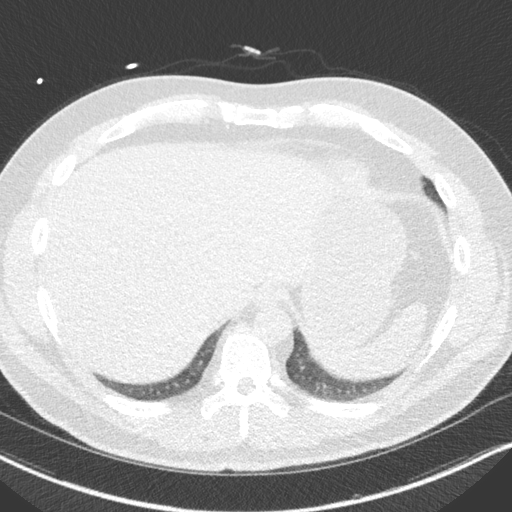
[im 13/37  lung]
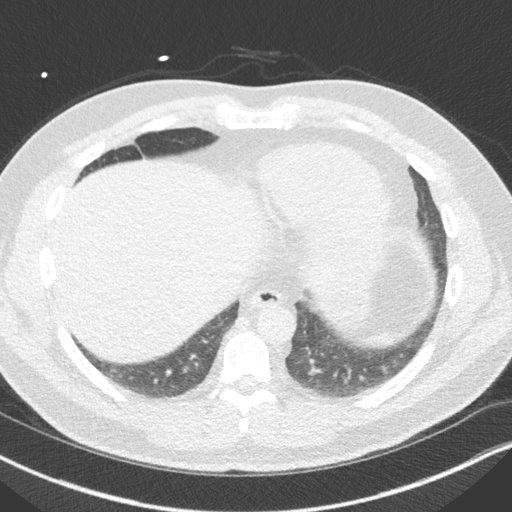
[im 19/37  lung]
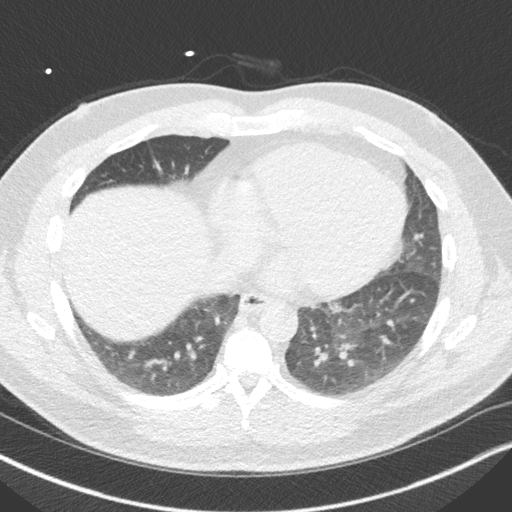
[im 25/37  lung]
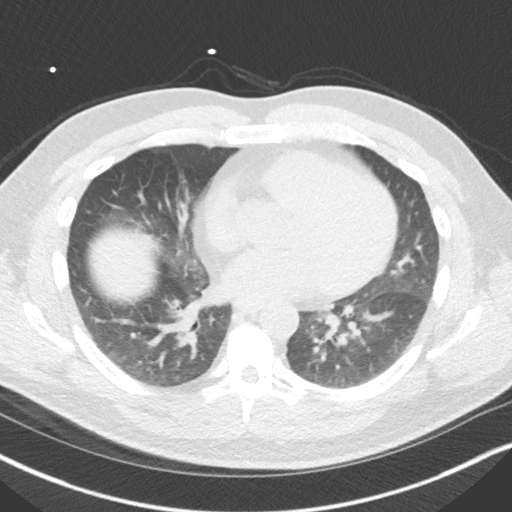
[im 31/37  lung]
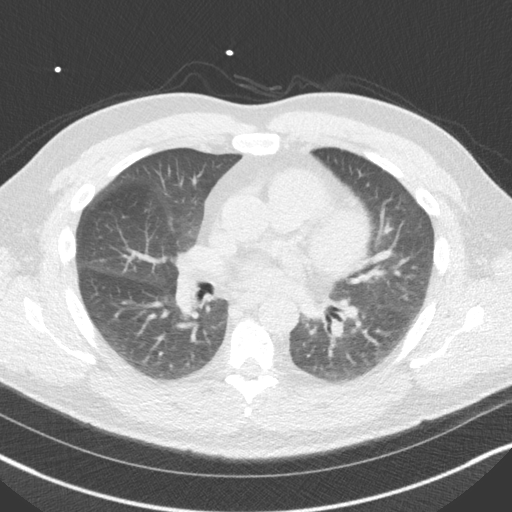

[14 of 20 positions shown; findings below may reference images not displayed]

FINDINGS: Vascular: Normal aortic caliber.

Mediastinum/Nodes: No imaged thoracic adenopathy.

Lungs/Pleura: No pleural fluid. Right middle lobe 3 mm subpleural
pulmonary nodule on [DATE].

Upper Abdomen: Normal imaged portions of the liver, spleen, stomach.

Musculoskeletal: No acute osseous abnormality.
IMPRESSION: 1.  No acute findings in the imaged extracardiac chest.
2. Right middle lobe 3 mm pulmonary nodule, favored to represent a
subpleural lymph node. No follow-up needed if patient is low-risk.
Non-contrast chest CT can be considered in 12 months if patient is
high-risk. This recommendation follows the consensus statement:
Guidelines for Management of Incidental Pulmonary Nodules Detected
FINDINGS: Coronary Calcium Score:

Left main: 0

Left anterior descending artery: 0

Left circumflex artery: 0

Right coronary artery: 0

Total: 0

Percentile: NA

Pericardium: Normal.

Ascending Aorta: Normal caliber.

Non-cardiac: See separate report from [REDACTED].
IMPRESSION: Coronary calcium score of 0.



If CAC=0, it is reasonable to withhold statin therapy and reassess
in 5 to 10 years, as long as higher risk conditions are absent
(diabetes mellitus, family history of premature CHD in first degree
relatives (males <55 years; females <65 years), cigarette smoking,
or LDL >=190 mg/dL).

If CAC is 1 to 99, it is reasonable to initiate statin therapy for
patients >=55 years of age.

If CAC is >=100 or >=75th percentile, it is reasonable to initiate
statin therapy at any age.

Cardiology referral should be considered for patients with CAC
scores >=400 or >=75th percentile.

*9067 AHA/ACC/AACVPR/AAPA/ABC/TAKKEN/ZAKI/CLARITZA/Nabarro/ALNASER/AUJLA/GYM
Guideline on the Management of Blood Cholesterol: A Report of the
American College of Cardiology/American Heart Association Task Force
on Clinical Practice Guidelines. J Am Coll Cardiol.
4921;73(24):3780-3066.

*** End of Addendum ***
EXAM:
OVER-READ INTERPRETATION  CT CHEST

The following report is an over-read performed by radiologist Dr.
Misori Habiba [REDACTED] on 02/06/2021. This over-read
does not include interpretation of cardiac or coronary anatomy or
pathology. The calcium score interpretation by the cardiologist is
attached.
FINDINGS: Vascular: Normal aortic caliber.

Mediastinum/Nodes: No imaged thoracic adenopathy.

Lungs/Pleura: No pleural fluid. Right middle lobe 3 mm subpleural
pulmonary nodule on [DATE].

Upper Abdomen: Normal imaged portions of the liver, spleen, stomach.

Musculoskeletal: No acute osseous abnormality.
IMPRESSION: 1.  No acute findings in the imaged extracardiac chest.
2. Right middle lobe 3 mm pulmonary nodule, favored to represent a
subpleural lymph node. No follow-up needed if patient is low-risk.
Non-contrast chest CT can be considered in 12 months if patient is
high-risk. This recommendation follows the consensus statement:
Guidelines for Management of Incidental Pulmonary Nodules Detected

## 2024-10-23 ENCOUNTER — Other Ambulatory Visit: Payer: Self-pay | Admitting: Urology

## 2024-11-02 ENCOUNTER — Encounter (HOSPITAL_COMMUNITY): Payer: Self-pay

## 2024-11-09 NOTE — Patient Instructions (Signed)
 SURGICAL WAITING ROOM VISITATION Patients having surgery or a procedure may have no more than 2 support people in the waiting area - these visitors may rotate in the visitor waiting room.   Due to an increase in RSV and influenza rates and associated hospitalizations, children ages 51 and under may not visit patients in [facility] hospitals. If the patient needs to stay at the hospital during part of their recovery, the visitor guidelines for inpatient rooms apply.  PRE-OP VISITATION  Pre-op nurse will coordinate an appropriate time for 1 support person to accompany the patient in pre-op.  This support person may not rotate.  This visitor will be contacted when the time is appropriate for the visitor to come back in the pre-op area.  Please refer to the [facility] website for the visitor guidelines for Inpatients (after your surgery is over and you are in a regular room).  You are not required to quarantine at this time prior to your surgery. However, you must do this: Hand Hygiene often Do NOT share personal items Notify your provider if you are in close contact with someone who has COVID or you develop fever 100.4 or greater, new onset of sneezing, cough, sore throat, shortness of breath or body aches.  If you test positive for Covid or have been in contact with anyone that has tested positive in the last 10 days please notify you surgeon.    Your procedure is scheduled on:  11/14/24  Report to [facility] Main Entrance: [place] entrance where the [employer] is available.   Report to admitting at: 7:15 AM  Call this number if you have any questions or problems the morning of surgery [phone]  FOLLOW ANY ADDITIONAL PRE OP INSTRUCTIONS YOU RECEIVED FROM YOUR SURGEON'S OFFICE!!!  Do not eat food or drink fluids after Midnight the night prior to your surgery/procedure.   Oral Hygiene is also important to reduce your risk of infection.        Remember - BRUSH YOUR TEETH  THE MORNING OF SURGERY WITH YOUR REGULAR TOOTHPASTE  Do NOT smoke after Midnight the night before surgery.  STOP TAKING all Vitamins, Herbs and supplements 1 week before your surgery.   Take ONLY these medicines the morning of surgery with A SIP OF WATER: bupropion,sertraline,allopurinol,tamsulosin.                  You may not have any metal on your body including hair pins, jewelry, and body piercing  Do not wear lotions, powders, perfumes / cologne, or deodorant   Men may shave face and neck.  Contacts, Hearing Aids, dentures or bridgework may not be worn into surgery. DENTURES WILL BE REMOVED PRIOR TO SURGERY PLEASE DO NOT APPLY Poly grip OR ADHESIVES!!!  You may bring a small overnight bag with you on the day of surgery, only pack items that are not valuable. [facility] IS NOT RESPONSIBLE   FOR VALUABLES THAT ARE LOST OR STOLEN.   Patients discharged on the day of surgery will not be allowed to drive home.  Someone NEEDS to stay with you for the first 24 hours after anesthesia.  Do not bring your home medications to the hospital. The Pharmacy will dispense medications listed on your medication list to you during your admission in the Hospital.  Special Instructions: Bring a copy of your healthcare power of attorney and living will documents the day of surgery, if you wish to have them scanned into your [facility]  Records- EPIC  Please read over the following fact sheets you were given: IF YOU HAVE QUESTIONS ABOUT YOUR PRE-OP INSTRUCTIONS, PLEASE CALL [phone]   [facility] - Preparing for Surgery      Before surgery, you can play an important role.  Because skin is not sterile, your skin needs to be as free of germs as possible.  You can reduce the number of germs on your skin by washing with CHG (chlorahexidine gluconate) soap before surgery.  CHG is an antiseptic cleaner which kills germs and bonds with the skin to continue killing germs even after washing. Please  DO NOT use if you have an allergy to CHG or antibacterial soaps.  If your skin becomes reddened/irritated stop using the CHG and inform your nurse when you arrive at Short Stay. Do not shave (including legs and underarms) for at least 48 hours prior to the first CHG shower.  You may shave your face/neck.  Please follow these instructions carefully:  1.  Shower with CHG Soap the night before surgery ONLY (DO NOT USE THE SOAP THE MORNING OF SURGERY).  2.  If you choose to wash your hair, wash your hair first as usual with your normal  shampoo.  3.  After you shampoo, rinse your hair and body thoroughly to remove the shampoo.                             4.  Use CHG as you would any other liquid soap.  You can apply chg directly to the skin and wash.  Gently with a scrungie or clean washcloth.  5.  Apply the CHG Soap to your body ONLY FROM THE NECK DOWN.   Do not use on face/ open                           Wound or open sores. Avoid contact with eyes, ears mouth and genitals (private parts).                       Wash face,  Genitals (private parts) with your normal soap.             6.  Wash thoroughly, paying special attention to the area where your  surgery  will be performed.  7.  Thoroughly rinse your body with warm water from the neck down.  8.  DO NOT shower/wash with your normal soap after using and rinsing off the CHG Soap.                9.  Pat yourself dry with a clean towel.            10.  Wear clean pajamas.            11.  Place clean sheets on your bed the night of your first shower and do not  sleep with pets.  Day of Surgery : Do not apply any CHG, lotions/deodorants the morning of surgery.  Please wear clean clothes to the hospital/surgery center.   FAILURE TO FOLLOW THESE INSTRUCTIONS MAY RESULT IN THE CANCELLATION OF YOUR SURGERY  PATIENT SIGNATURE_________________________________  NURSE  SIGNATURE__________________________________  ________________________________________________________________________

## 2024-11-10 ENCOUNTER — Encounter (HOSPITAL_COMMUNITY): Payer: Self-pay

## 2024-11-10 ENCOUNTER — Other Ambulatory Visit: Payer: Self-pay

## 2024-11-10 ENCOUNTER — Encounter (HOSPITAL_COMMUNITY)
Admission: RE | Admit: 2024-11-10 | Discharge: 2024-11-10 | Disposition: A | Source: Ambulatory Visit | Attending: Urology | Admitting: Urology

## 2024-11-10 VITALS — BP 138/92 | HR 68 | Temp 98.0°F | Ht 67.0 in | Wt 188.0 lb

## 2024-11-10 DIAGNOSIS — I10 Essential (primary) hypertension: Secondary | ICD-10-CM | POA: Diagnosis not present

## 2024-11-10 DIAGNOSIS — Z01818 Encounter for other preprocedural examination: Secondary | ICD-10-CM | POA: Insufficient documentation

## 2024-11-10 HISTORY — DX: Anxiety disorder, unspecified: F41.9

## 2024-11-10 HISTORY — DX: Prediabetes: R73.03

## 2024-11-10 HISTORY — DX: Depression, unspecified: F32.A

## 2024-11-10 LAB — CBC
HCT: 46.2 % (ref 39.0–52.0)
Hemoglobin: 15.3 g/dL (ref 13.0–17.0)
MCH: 30.8 pg (ref 26.0–34.0)
MCHC: 33.1 g/dL (ref 30.0–36.0)
MCV: 93.1 fL (ref 80.0–100.0)
Platelets: 261 K/uL (ref 150–400)
RBC: 4.96 MIL/uL (ref 4.22–5.81)
RDW: 12.6 % (ref 11.5–15.5)
WBC: 6.6 K/uL (ref 4.0–10.5)
nRBC: 0 % (ref 0.0–0.2)

## 2024-11-10 LAB — BASIC METABOLIC PANEL WITH GFR
Anion gap: 9 (ref 5–15)
BUN: 13 mg/dL (ref 6–20)
CO2: 28 mmol/L (ref 22–32)
Calcium: 9.7 mg/dL (ref 8.9–10.3)
Chloride: 104 mmol/L (ref 98–111)
Creatinine, Ser: 1.21 mg/dL (ref 0.61–1.24)
GFR, Estimated: >60 mL/min
Glucose, Bld: 108 mg/dL — ABNORMAL HIGH (ref 70–99)
Potassium: 4 mmol/L (ref 3.5–5.1)
Sodium: 140 mmol/L (ref 135–145)

## 2024-11-10 NOTE — Progress Notes (Addendum)
 For Anesthesia: PCP - Jama Chow, MD  Cardiologist - Lavona Agent, MD LOV: 01/10/21  Bowel Prep reminder:  Chest x-ray -  EKG - 04/26/24: Media Stress Test -  ECHO -  Cardiac Cath -  Pacemaker/ICD device last checked: Pacemaker orders received: Device Rep notified:  Spinal Cord Stimulator:N/A  Sleep Study - Yes  CPAP - NO  Fasting Blood Sugar -  N/A Checks Blood Sugar _____ times a day Date and result of last Hgb A1c-  Last dose of GLP1 agonist- N/A GLP1 instructions: Hold 7 days prior to schedule (Hold 24 hours-daily)   Last dose of SGLT-2 inhibitors- N/A SGLT-2 instructions: Hold 72 hours prior to surgery  Blood Thinner Instructions:N/A Last Dose: Time last taken:  Aspirin Instructions:N/A Last Dose: Time last taken:  Activity level: Can go up a flight of stairs and activities of daily living without stopping and without chest pain and/or shortness of breath   Able to exercise without chest pain and/or shortness of breath  Anesthesia review: Hx: HTN,OSA(NO CPAP)  Patient denies shortness of breath, fever, cough and chest pain at PAT appointment   Patient verbalized understanding of instructions that were reviewed over the telephone.

## 2024-11-14 ENCOUNTER — Ambulatory Visit (HOSPITAL_COMMUNITY): Admitting: Anesthesiology

## 2024-11-14 ENCOUNTER — Encounter: Admission: RE | Payer: Self-pay | Source: Home / Self Care

## 2024-11-14 ENCOUNTER — Ambulatory Visit (HOSPITAL_COMMUNITY)
Admission: RE | Admit: 2024-11-14 | Discharge: 2024-11-14 | Disposition: A | Payer: Self-pay | Attending: Urology | Admitting: Urology

## 2024-11-14 ENCOUNTER — Ambulatory Visit (HOSPITAL_COMMUNITY)

## 2024-11-14 ENCOUNTER — Encounter (HOSPITAL_COMMUNITY): Payer: Self-pay | Admitting: Urology

## 2024-11-14 ENCOUNTER — Other Ambulatory Visit: Payer: Self-pay

## 2024-11-14 DIAGNOSIS — G4730 Sleep apnea, unspecified: Secondary | ICD-10-CM | POA: Insufficient documentation

## 2024-11-14 DIAGNOSIS — N202 Calculus of kidney with calculus of ureter: Secondary | ICD-10-CM | POA: Diagnosis not present

## 2024-11-14 DIAGNOSIS — I10 Essential (primary) hypertension: Secondary | ICD-10-CM | POA: Insufficient documentation

## 2024-11-14 DIAGNOSIS — N201 Calculus of ureter: Secondary | ICD-10-CM | POA: Diagnosis present

## 2024-11-14 LAB — GLUCOSE, CAPILLARY: Glucose-Capillary: 102 mg/dL — ABNORMAL HIGH (ref 70–99)

## 2024-11-14 MED ORDER — IOHEXOL 300 MG/ML  SOLN
INTRAMUSCULAR | Status: DC | PRN
  Administered 2024-11-14: 7.5 mL

## 2024-11-14 MED ORDER — MIDAZOLAM HCL 2 MG/2ML IJ SOLN
INTRAMUSCULAR | Status: AC
  Filled 2024-11-14: qty 2

## 2024-11-14 MED ORDER — CEFAZOLIN SODIUM-DEXTROSE 2-4 GM/100ML-% IV SOLN
2.0000 g | INTRAVENOUS | Status: AC
  Administered 2024-11-14: 2 g via INTRAVENOUS
  Filled 2024-11-14: qty 100

## 2024-11-14 MED ORDER — LIDOCAINE HCL (PF) 2 % IJ SOLN
INTRAMUSCULAR | Status: DC | PRN
  Administered 2024-11-14: 100 mg via INTRADERMAL

## 2024-11-14 MED ORDER — DEXAMETHASONE SOD PHOSPHATE PF 10 MG/ML IJ SOLN
INTRAMUSCULAR | Status: DC | PRN
  Administered 2024-11-14: 10 mg via INTRAVENOUS

## 2024-11-14 MED ORDER — OXYCODONE HCL 5 MG PO TABS: 5.0000 mg | ORAL_TABLET | ORAL | Status: DC | PRN

## 2024-11-14 MED ORDER — FENTANYL CITRATE (PF) 100 MCG/2ML IJ SOLN
INTRAMUSCULAR | Status: DC | PRN
  Administered 2024-11-14 (×2): 50 ug via INTRAVENOUS

## 2024-11-14 MED ORDER — OXYCODONE HCL 5 MG/5ML PO SOLN: 5.0000 mg | Freq: Once | ORAL | Status: DC | PRN

## 2024-11-14 MED ORDER — SODIUM CHLORIDE 0.9 % IV SOLN: 250.0000 mL | INTRAVENOUS | Status: DC | PRN

## 2024-11-14 MED ORDER — SODIUM CHLORIDE 0.9% FLUSH: 3.0000 mL | Freq: Two times a day (BID) | INTRAVENOUS | Status: DC

## 2024-11-14 MED ORDER — LACTATED RINGERS IV SOLN: INTRAVENOUS | Status: DC

## 2024-11-14 MED ORDER — VASOPRESSIN 20 UNIT/ML IV SOLN
INTRAVENOUS | Status: AC
  Filled 2024-11-14: qty 1

## 2024-11-14 MED ORDER — PROPOFOL 10 MG/ML IV BOLUS
INTRAVENOUS | Status: AC
  Filled 2024-11-14: qty 20

## 2024-11-14 MED ORDER — MIDAZOLAM HCL (PF) 2 MG/2ML IJ SOLN
INTRAMUSCULAR | Status: DC | PRN
  Administered 2024-11-14: 2 mg via INTRAVENOUS

## 2024-11-14 MED ORDER — HYDROCODONE-ACETAMINOPHEN 5-325 MG PO TABS: 1.0000 | ORAL_TABLET | Freq: Four times a day (QID) | ORAL | 0 refills | Status: AC | PRN

## 2024-11-14 MED ORDER — OXYCODONE HCL 5 MG PO TABS: 5.0000 mg | ORAL_TABLET | Freq: Once | ORAL | Status: DC | PRN

## 2024-11-14 MED ORDER — ACETAMINOPHEN 650 MG RE SUPP
650.0000 mg | RECTAL | Status: DC | PRN
  Filled 2024-11-14: qty 1

## 2024-11-14 MED ORDER — SODIUM CHLORIDE 0.9 % IR SOLN
Status: DC | PRN
  Administered 2024-11-14: 3000 mL via INTRAVESICAL

## 2024-11-14 MED ORDER — EPHEDRINE SULFATE (PRESSORS) 25 MG/5ML IV SOSY
PREFILLED_SYRINGE | INTRAVENOUS | Status: DC | PRN
  Administered 2024-11-14: 10 mg via INTRAVENOUS

## 2024-11-14 MED ORDER — ONDANSETRON HCL 4 MG/2ML IJ SOLN: 4.0000 mg | Freq: Four times a day (QID) | INTRAMUSCULAR | Status: DC | PRN

## 2024-11-14 MED ORDER — CHLORHEXIDINE GLUCONATE 0.12 % MT SOLN
15.0000 mL | Freq: Once | OROMUCOSAL | Status: AC
  Administered 2024-11-14: 15 mL via OROMUCOSAL

## 2024-11-14 MED ORDER — ACETAMINOPHEN 325 MG PO TABS: 650.0000 mg | ORAL_TABLET | ORAL | Status: DC | PRN

## 2024-11-14 MED ORDER — PROPOFOL 10 MG/ML IV BOLUS
INTRAVENOUS | Status: DC | PRN
  Administered 2024-11-14: 200 mg via INTRAVENOUS

## 2024-11-14 MED ORDER — MORPHINE SULFATE (PF) 2 MG/ML IV SOLN: 2.0000 mg | INTRAVENOUS | Status: DC | PRN

## 2024-11-14 MED ORDER — LACTATED RINGERS IV SOLN: INTRAVENOUS | Status: DC | PRN

## 2024-11-14 MED ORDER — FENTANYL CITRATE (PF) 50 MCG/ML IJ SOSY: 25.0000 ug | PREFILLED_SYRINGE | INTRAMUSCULAR | Status: DC | PRN

## 2024-11-14 MED ORDER — PHENYLEPHRINE 80 MCG/ML (10ML) SYRINGE FOR IV PUSH (FOR BLOOD PRESSURE SUPPORT)
PREFILLED_SYRINGE | INTRAVENOUS | Status: DC | PRN
  Administered 2024-11-14 (×2): 80 ug via INTRAVENOUS

## 2024-11-14 MED ORDER — ONDANSETRON HCL 4 MG/2ML IJ SOLN
INTRAMUSCULAR | Status: DC | PRN
  Administered 2024-11-14: 4 mg via INTRAVENOUS

## 2024-11-14 MED ORDER — ORAL CARE MOUTH RINSE: 15.0000 mL | Freq: Once | OROMUCOSAL | Status: AC

## 2024-11-14 MED ORDER — SODIUM CHLORIDE 0.9% FLUSH: 3.0000 mL | INTRAVENOUS | Status: DC | PRN

## 2024-11-14 MED ORDER — FENTANYL CITRATE (PF) 100 MCG/2ML IJ SOLN
INTRAMUSCULAR | Status: AC
  Filled 2024-11-14: qty 2

## 2024-11-14 MED ORDER — PHENYLEPHRINE 80 MCG/ML (10ML) SYRINGE FOR IV PUSH (FOR BLOOD PRESSURE SUPPORT)
PREFILLED_SYRINGE | INTRAVENOUS | Status: AC
  Filled 2024-11-14: qty 10

## 2024-11-14 NOTE — Discharge Instructions (Signed)
 You may remove the stent Friday morning.  If you don't feel you can do that, call the office to set up a time to have it removed.

## 2024-11-14 NOTE — Op Note (Signed)
 Procedure: 1.  Cystoscopy with left retrograde pyelogram and interpretation. 2.  Left ureteroscopy with holmium laser application, stone extraction and insertion of double-J stent. 3.  Application of fluoroscopy.  Pre-op diagnosis: Left ureteral and renal stones.  Postop diagnosis: Same.  Surgeon: Dr. Norleen Seltzer.  Anesthesia: General.  Specimen: Stone fragments given to family.  Drains: 6 French by 26 cm left Contour double-J stent with tether.  EBL: None.  Complications: None.  Indications: The patient is a 59 year old male who was seen in the office recently with left flank pain and was found to have a left proximal ureteral stone approximately 5 mm in size.  He was also noted to have left renal stones up to approximately 1 cm.  He has elected ureteroscopy for management.  Procedure: He was taken the operating room where he was given antibiotics.  A general anesthetic was induced.  He was placed in lithotomy position and fitted with PAS hose.  His perineum and genitalia were prepped with Betadine solution and draped in usual sterile fashion.  Cystoscopy was performed using 21 French scope and 30 degree lens.  Examination revealed a normal urethra.  The external sphincter was intact.  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  Examination of bladder revealed mild trabeculation without tumors, stones or inflammation.  Ureteral orifices were unremarkable.  The left ureteral orifice was cannulated with 5 French open-ended catheter and Omnipaque  was instilled  Left retrograde pyelogram demonstrated a normal caliber ureter with a filling defect over the lateral edge of the mid sacrum consistent with his known ureteral stone.  It had moved from the proximal ureter to the distal ureter.  There was mild dilation proximally.  A sensor wire was then advanced the kidney alongside the stone and the cystoscope was removed.  The  ureter was then dilated with the inner core of a 12/14 French 36 cm digital access sheath followed by the assembled sheath.  The access sheath was then removed and the dual-lumen semirigid 6.5 French ureteroscope was then advanced alongside the wire.  The stone was visualized and brought back into a more distal location with an engage basket before was fragmented with a 242 m holmium laser fiber with the Moses laser set on the dusting setting.  Both pedals were used.  Once the stone was adequately fragmented, the fragments moved to the bladder with the engage basket.  The access sheath was then replaced over the wire and advanced to the proximal ureter where the inner core and wire were removed.  The dual lumen digital flexible scope was then advanced to the kidney and the collecting system was inspected.  Stones were noted in upper mid and lower calyces.  And all of the stones were then fragmented.  Several were adherent to the papillary tips and were released.  Fragments up to 2 to 3 mm in size were removed with the engage basket until only grit and small fragments less than 2 mm in size remained.  This was confirmed both visually and fluoroscopically.  The ureteroscope was then removed over the wire and the ureter was inspected throughout its length without evidence of ureteral injury.  The cystoscope was then reinserted over the wire and a 6 French by 26 cm contour double-J stent with tether was advanced the kidney under fluoroscopic guidance.  The wire was removed, leaving a good coil in the kidney and a good coil in the bladder.  The  cystoscope was removed leaving the stent string exiting urethra.  The cystoscope was then reinserted alongside the string and the fragments from the initial ureteroscopy were removed.  The bladder was then partially drained and the cystoscope was removed.  The string was secured to the patient's penis and the stone fragments were collected given to the patient family.  He was  taken down from lithotomy position, his anesthetic was reversed and he was moved to cart room in stable condition.  There were no complications.

## 2024-11-14 NOTE — Anesthesia Procedure Notes (Signed)
 Procedure Name: LMA Insertion Date/Time: 11/14/2024 9:35 AM  Performed by: Obadiah Reyes BROCKS, CRNAPre-anesthesia Checklist: Patient identified, Emergency Drugs available, Suction available and Patient being monitored Patient Re-evaluated:Patient Re-evaluated prior to induction Oxygen Delivery Method: Circle System Utilized Preoxygenation: Pre-oxygenation with 100% oxygen Induction Type: IV induction Ventilation: Mask ventilation without difficulty LMA: LMA inserted LMA Size: 4.0 Number of attempts: 1 Airway Equipment and Method: Bite block Placement Confirmation: positive ETCO2 Tube secured with: Tape Dental Injury: Teeth and Oropharynx as per pre-operative assessment

## 2024-11-14 NOTE — Interval H&P Note (Signed)
 History and Physical Interval Note:  The stone has moved to the distal ureter.  He would like to have the renal stones treated as well.   11/14/2024 8:53 AM  Dakota Munoz  has presented today for surgery, with the diagnosis of LEFT URETERAL STONE.  The various methods of treatment have been discussed with the patient and family. After consideration of risks, benefits and other options for treatment, the patient has consented to  Procedures: CYSTOSCOPY/URETEROSCOPY/HOLMIUM LASER/STENT PLACEMENT (Left) as a surgical intervention.  The patient's history has been reviewed, patient examined, no change in status, stable for surgery.  I have reviewed the patient's chart and labs.  Questions were answered to the patient's satisfaction.     Alajiah Dutkiewicz

## 2024-11-14 NOTE — Transfer of Care (Signed)
 Immediate Anesthesia Transfer of Care Note  Patient: Dakota Munoz  Procedure(s) Performed: CYSTOSCOPY/URETEROSCOPY/HOLMIUM LASER/STENT PLACEMENT (Left: Ureter)  Patient Location: PACU  Anesthesia Type:General  Level of Consciousness: sedated and responds to stimulation  Airway & Oxygen Therapy: Patient Spontanous Breathing and Patient connected to nasal cannula oxygen  Post-op Assessment: Report given to RN and Post -op Vital signs reviewed and stable  Post vital signs: Reviewed and stable  Last Vitals:  Vitals Value Taken Time  BP 121/79 11/14/24 11:15  Temp    Pulse 66 11/14/24 11:16  Resp 15 11/14/24 11:16  SpO2 99 % 11/14/24 11:16  Vitals shown include unfiled device data.  Last Pain:  Vitals:   11/14/24 0751  TempSrc: Oral  PainSc: 0-No pain         Complications: No notable events documented.

## 2024-11-14 NOTE — Anesthesia Preprocedure Evaluation (Signed)
"                                    Anesthesia Evaluation  Patient identified by MRN, date of birth, ID band Patient awake    Reviewed: Allergy & Precautions, H&P , NPO status , Patient's Chart, lab work & pertinent test results  Airway Mallampati: II   Neck ROM: full    Dental   Pulmonary sleep apnea    breath sounds clear to auscultation       Cardiovascular hypertension,  Rhythm:regular Rate:Normal     Neuro/Psych  PSYCHIATRIC DISORDERS Anxiety Depression       GI/Hepatic   Endo/Other    Renal/GU stones     Musculoskeletal   Abdominal   Peds  Hematology   Anesthesia Other Findings   Reproductive/Obstetrics                              Anesthesia Physical Anesthesia Plan  ASA: 2  Anesthesia Plan: General   Post-op Pain Management:    Induction: Intravenous  PONV Risk Score and Plan: 2 and Ondansetron , Dexamethasone , Midazolam  and Treatment may vary due to age or medical condition  Airway Management Planned: LMA  Additional Equipment:   Intra-op Plan:   Post-operative Plan: Extubation in OR  Informed Consent: I have reviewed the patients History and Physical, chart, labs and discussed the procedure including the risks, benefits and alternatives for the proposed anesthesia with the patient or authorized representative who has indicated his/her understanding and acceptance.     Dental advisory given  Plan Discussed with: CRNA, Anesthesiologist and Surgeon  Anesthesia Plan Comments:         Anesthesia Quick Evaluation  "

## 2024-11-15 ENCOUNTER — Encounter (HOSPITAL_COMMUNITY): Payer: Self-pay | Admitting: Urology

## 2024-11-16 NOTE — Anesthesia Postprocedure Evaluation (Signed)
"   Anesthesia Post Note  Patient: Dakota Munoz  Procedure(s) Performed: CYSTOSCOPY/URETEROSCOPY/HOLMIUM LASER/STENT PLACEMENT (Left: Ureter)     Patient location during evaluation: PACU Anesthesia Type: General Level of consciousness: awake and alert Pain management: pain level controlled Vital Signs Assessment: post-procedure vital signs reviewed and stable Respiratory status: spontaneous breathing, nonlabored ventilation, respiratory function stable and patient connected to nasal cannula oxygen Cardiovascular status: blood pressure returned to baseline and stable Postop Assessment: no apparent nausea or vomiting Anesthetic complications: no   No notable events documented.  Last Vitals:  Vitals:   11/14/24 1215 11/14/24 1330  BP: 135/88 124/86  Pulse: 79 83  Resp: 13 20  Temp:    SpO2: 94% 93%    Last Pain:  Vitals:   11/14/24 1330  TempSrc:   PainSc: 0-No pain                 Eleanore Junio S      "
